# Patient Record
Sex: Male | Born: 1985 | Race: White | Hispanic: No | Marital: Married | Smoking: Never smoker
Health system: Southern US, Community
[De-identification: ages and names within clinical notes are randomized; demographics above are authoritative.]

## PROBLEM LIST (undated history)

## (undated) DIAGNOSIS — S83106A Unspecified dislocation of unspecified knee, initial encounter: Secondary | ICD-10-CM

## (undated) HISTORY — PX: TONSILLECTOMY: SUR1361

---
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
## 2006-07-27 ENCOUNTER — Emergency Department (HOSPITAL_COMMUNITY): Admission: EM | Admit: 2006-07-27 | Discharge: 2006-07-27 | Payer: Self-pay | Admitting: *Deleted

## 2007-01-23 ENCOUNTER — Emergency Department (HOSPITAL_COMMUNITY): Admission: EM | Admit: 2007-01-23 | Discharge: 2007-01-23 | Payer: Self-pay | Admitting: Family Medicine

## 2008-10-05 ENCOUNTER — Emergency Department (HOSPITAL_COMMUNITY): Admission: EM | Admit: 2008-10-05 | Discharge: 2008-10-05 | Payer: Self-pay | Admitting: Emergency Medicine

## 2010-07-31 NOTE — Op Note (Signed)
NAME:  Cole Wright, Cole Wright                         ACCOUNT NO.:  [id number]   MEDICAL RECORD NO.:  [id number]                   PATIENT TYPE:  AMB   LOCATION:  DSC                                  FACILITY:  [facility]   PHYSICIAN:  Kinnie Scales. Annalee Genta, M.D.            DATE OF BIRTH:  1986/03/05   DATE OF PROCEDURE:  09/24/2002  DATE OF DISCHARGE:                                 OPERATIVE REPORT   PREOPERATIVE DIAGNOSIS:  1. Adenotonsillar hypertrophy.  2. Recurrent tonsillitis.   POSTOPERATIVE DIAGNOSIS:  1. Adenotonsillar hypertrophy.  2. Recurrent tonsillitis.   OPERATION PERFORMED:  Tonsillectomy and adenoidectomy.   SURGEON:  Kinnie Scales. Annalee Genta, M.D.   ANESTHESIA:  General endotracheal.   COMPLICATIONS:  None.   ESTIMATED BLOOD LOSS:  Minimal.   DISPOSITION:  Patient transferred from the operating room to the recovery  room in stable condition.   INDICATIONS FOR PROCEDURE:  1. Adenotonsillar hypertrophy.  2. Recurrent tonsillitis.   BRIEF HISTORY:  [name] is a 22-1/2-year-old white male who was referred for  evaluation of recurrent acute tonsillitis.  The patient had multiple courses  of infection in the last year requiring antibiotic therapy and was found to  have adenotonsillar hypertrophy and chronic tonsillar discharge on  examination.  Given the patient's history, examination and findings, I  recommended that we undertake tonsillectomy and adenoidectomy under general  anesthesia.  The risks, benefits and possible complications of these  surgical procedures were discussed in detail with the patient and his  parents who understood and concurred with our plan for surgery which was  scheduled as above.   DESCRIPTION OF PROCEDURE:  The patient was brought to the operating room on  September 24, 2002 and placed in supine position on the operating table.  General  endotracheal anesthesia was established without difficulty.  When the  patient was adequately anesthetized, a  Crowe-Davis mouth gag was inserted  without difficulty.  There were no loose or broken teeth and the hard and  soft palate were intact.  The patient's posterior nasopharynx was examined.  He was found to have adenoidal hypertrophy and the adenoids were treated  using adenoidal ablation.  With suction cautery set at 45W, adenoid tissue  was ablated.  There was no bleeding.  The nasopharynx was irrigated and  suctioned.  Attention was then turned to the patient's tonsils.  Beginning  on the left hand side and dissected in subcapsular fashion, the entire left  tonsil was resected using a Harmonics scalpel.  Dissection carried out from  superior pole to tongue base.  The right tonsil was then removed in a  similar fashion.  Tonsil tissue sent to pathology for gross and microscopic  evaluation.  Tonsillar fossa was gently abraded with a dry tonsil sponge and  Crowe-Davis mouth gag was released and reapplied.  There were several small  areas of point hemorrhage which were cauterized with suction cautery.  No  evidence of active bleeding.  An orogastric tube was passed and the stomach  contents were aspirated.  The patient's nasal cavity and nasopharynx, oral  cavity and oropharynx were then thoroughly irrigated and suctioned.  The  Crowe-Davis mouth gag was released and removed.  There were no loose or  broken teeth.  The patient was then awakened from his anesthetic.  He was  extubated and was transferred from the operating room to recovery room in  stable condition.                                                 Kinnie Scales. Annalee Genta, M.D.    DLS/MEDQ  D:  52/84/1324  T:  09/24/2002  Job:  [id number]

## 2013-06-13 ENCOUNTER — Other Ambulatory Visit: Payer: Self-pay | Admitting: Physician Assistant

## 2013-06-13 ENCOUNTER — Ambulatory Visit
Admission: RE | Admit: 2013-06-13 | Discharge: 2013-06-13 | Disposition: A | Discharge status: Home / Self Care | Payer: BC Managed Care – PPO | Source: Ambulatory Visit | Attending: Physician Assistant | Admitting: Physician Assistant

## 2013-06-13 DIAGNOSIS — R059 Cough, unspecified: Secondary | ICD-10-CM

## 2013-06-13 DIAGNOSIS — R05 Cough: Secondary | ICD-10-CM

## 2016-08-15 ENCOUNTER — Emergency Department (HOSPITAL_COMMUNITY): Payer: BLUE CROSS/BLUE SHIELD

## 2016-08-15 ENCOUNTER — Emergency Department (HOSPITAL_COMMUNITY)
Admission: EM | Admit: 2016-08-15 | Discharge: 2016-08-15 | Disposition: A | Discharge status: Home / Self Care | Payer: BLUE CROSS/BLUE SHIELD | Attending: Emergency Medicine | Admitting: Emergency Medicine

## 2016-08-15 ENCOUNTER — Encounter (HOSPITAL_COMMUNITY): Payer: Self-pay

## 2016-08-15 DIAGNOSIS — Z79899 Other long term (current) drug therapy: Secondary | ICD-10-CM | POA: Insufficient documentation

## 2016-08-15 DIAGNOSIS — Y999 Unspecified external cause status: Secondary | ICD-10-CM | POA: Diagnosis not present

## 2016-08-15 DIAGNOSIS — Y929 Unspecified place or not applicable: Secondary | ICD-10-CM | POA: Diagnosis not present

## 2016-08-15 DIAGNOSIS — S79922A Unspecified injury of left thigh, initial encounter: Secondary | ICD-10-CM | POA: Diagnosis present

## 2016-08-15 DIAGNOSIS — S72422A Displaced fracture of lateral condyle of left femur, initial encounter for closed fracture: Secondary | ICD-10-CM | POA: Insufficient documentation

## 2016-08-15 DIAGNOSIS — X501XXA Overexertion from prolonged static or awkward postures, initial encounter: Secondary | ICD-10-CM | POA: Insufficient documentation

## 2016-08-15 DIAGNOSIS — Y939 Activity, unspecified: Secondary | ICD-10-CM | POA: Diagnosis not present

## 2016-08-15 HISTORY — DX: Unspecified dislocation of unspecified knee, initial encounter: S83.106A

## 2016-08-15 MED ORDER — IBUPROFEN 200 MG PO TABS
600.0000 mg | ORAL_TABLET | Freq: Once | ORAL | Status: AC
  Administered 2016-08-15: 600 mg via ORAL
  Filled 2016-08-15: qty 3

## 2016-08-15 MED ORDER — OXYCODONE-ACETAMINOPHEN 5-325 MG PO TABS
1.0000 | ORAL_TABLET | Freq: Four times a day (QID) | ORAL | 0 refills | Status: DC | PRN
Start: 2016-08-15 — End: 2022-03-10

## 2016-08-15 NOTE — ED Triage Notes (Signed)
Pt hx of knee dislocation.  Pt states stood up today and felt left knee pop. Painful to ambulate

## 2016-08-15 NOTE — ED Provider Notes (Signed)
[facility]-EMERGENCY DEPT Provider Note   CSN: [id number] Arrival date & time: 08/15/16  1808     History   Chief Complaint Chief Complaint  Patient presents with  . Knee Pain    HPI Cole Wright is a 31 y.o. male.  HPI Patient Presents with a likely left knee patellar dislocation. States he's done in the past. States she was standing changing is close and he felt a pop in his left knee. States his kneecap was off to the side of the left. States that it sometimes pops off a little bit but usually not like this. States it popped out and then back on its own. No other injury. He did not bend abnormally.   Past Medical History:  Diagnosis Date  . Dislocated knee     There are no active problems to display for this patient.   Past Surgical History:  Procedure Laterality Date  . TONSILLECTOMY         Home Medications    Prior to Admission medications   Medication Sig Start Date End Date Taking? Authorizing Provider  oxyCODONE-acetaminophen (PERCOCET/ROXICET) 5-325 MG tablet Take 1-2 tablets by mouth every 6 (six) hours as needed for severe pain. 08/15/16   Benjiman CorePickering, Tonita Bills, MD    Family History History reviewed. No pertinent family history.  Social History Social History  Substance Use Topics  . Smoking status: Never Smoker  . Smokeless tobacco: Never Used  . Alcohol use No     Allergies   Food   Review of Systems Review of Systems  Constitutional: Negative for appetite change.  Respiratory: Negative for shortness of breath.   Cardiovascular: Negative for chest pain.  Musculoskeletal:       Left knee pain.  Skin: Negative for rash.  Neurological: Negative for headaches.  Psychiatric/Behavioral: Negative for confusion.     Physical Exam Updated Vital Signs BP 124/72 (BP Location: Right Arm)   Pulse 78   Temp 98.4 F (36.9 C) (Oral)   Resp 16   SpO2 99%   Physical Exam  Constitutional: He appears well-developed.  Eyes: Pupils are equal, round,  and reactive to light.  Pulmonary/Chest: Effort normal.  Abdominal: Soft. There is no tenderness.  Musculoskeletal: He exhibits tenderness.  Tenderness to left knee. Some effusion. Decreased range of motion due to pain. Somewhat tender to medial aspect of right patella. Neurovascular intact in left foot.  Neurological: He is alert.     ED Treatments / Results  Labs (all labs ordered are listed, but only abnormal results are displayed) Labs Reviewed - No data to display  EKG  EKG Interpretation None       Radiology Ct Knee Left Wo Contrast  Result Date: 08/15/2016 CLINICAL DATA:  Left knee fracture after twisting injury. EXAM: CT OF THE  KNEE WITHOUT CONTRAST TECHNIQUE: Multidetector CT imaging of the knee was performed according to the standard protocol. Multiplanar CT image reconstructions were also generated. COMPARISON:  Radiographs from earlier on the same day FINDINGS: Bones/Joint/Cartilage An acute, closed fracture of the posterolateral corner of the lateral femoral condyle is noted with shearing of the fracture fragment and displacement anteriorly between the femoral condyles. This fragment measures approximately 24 x 13 x 9 mm. A separate laterally displaced fracture fragment measuring 7 x 3 x 5 mm is also noted. The patellofemoral and medial femorotibial compartments are intact. No tibial plateau fracture. The fibular head is unremarkable. There is a moderate suprapatellar joint effusion with hematocrit level laterally. Ligaments Suboptimally assessed  by CT. The alignment of the collateral and cruciate ligaments appear anatomic however. Muscles and Tendons No intramuscular hemorrhage or muscle atrophy. Intact appearing extensor mechanism tendons and patellar retinaculum. Soft tissues No soft tissue mass or hematoma. IMPRESSION: 1. An acute, closed fracture of the posterolateral corner of the lateral femoral condyle is noted with shearing of the fracture fragment and displacement  anteriorly between the femoral condyles. This fragment measures approximately 24 x 13 x 9 mm. 2. A separate laterally displaced fracture fragment measuring 7 x 3 x 5 mm is also noted. 3. Moderate suprapatellar joint effusion with hematocrit level. Electronically Signed   By: Tollie Eth M.D.   On: 08/15/2016 21:50   Dg Knee Complete 4 Views Left  Result Date: 08/15/2016 CLINICAL DATA:  Acute onset of left knee pain and popping. Initial encounter. EXAM: LEFT KNEE - COMPLETE 4+ VIEW COMPARISON:  Left knee radiographs performed 11/29/2004 FINDINGS: There appears to be avulsion of a large 3 cm lateral fragment of the lateral femoral condyle, including a portion of the articular surface, with a residual 0.7 cm fragment noted at the fracture site. The avulsed fragment is situated at the anterior aspect of the joint space. This is concerning for a twisting injury, with avulsion secondary to torque from the popliteus tendon, and subsequent anterior drift of the fragment. An associated moderate knee joint effusion is noted. Underlying lateral meniscal injury cannot be excluded. Mild edema is seen at Hoffa's fat pad. Medial soft tissue swelling is noted about the knee. IMPRESSION: 1. Avulsion of a large 3 cm lateral fragment of the lateral femoral condyle, including a portion of the articular surface, with a residual 0.7 cm fragment noted at the fracture site. Avulsed fragment is situated at the anterior aspect of the joint space. 2. Associated moderate knee joint effusion noted. Underlying lateral meniscal injury cannot be excluded. MRI of the left knee is recommended for further evaluation, as deemed clinically appropriate. These results were called by telephone at the time of interpretation on 08/15/2016 at 7:04 pm to Dr. Benjiman Core, who verbally acknowledged these results. Electronically Signed   By: Roanna Raider M.D.   On: 08/15/2016 19:11    Procedures Procedures (including critical care  time)  Medications Ordered in ED Medications  ibuprofen (ADVIL,MOTRIN) tablet 600 mg (600 mg Oral Given 08/15/16 1840)     Initial Impression / Assessment and Plan / ED Course  I have reviewed the triage vital signs and the nursing notes.  Pertinent labs & imaging results that were available during my care of the patient were reviewed by me and considered in my medical decision making (see chart for details).     Patient with knee pain. Initially thought to be patellar dislocation since he's had this in the past. However x-ray showed lateral condyle fracture. Discussed with Dr. Victorino Dike. CT scan done here. Immobilized and will follow-up in the office tomorrow. He will likely require surgery.  Final Clinical Impressions(s) / ED Diagnoses   Final diagnoses:  Closed displaced fracture of lateral condyle of left femur, initial encounter [facility])    New Prescriptions Discharge Medication List as of 08/15/2016 10:30 PM    START taking these medications   Details  oxyCODONE-acetaminophen (PERCOCET/ROXICET) 5-325 MG tablet Take 1-2 tablets by mouth every 6 (six) hours as needed for severe pain., Starting Sun 08/15/2016, Print         Benjiman Core, MD 08/15/16 [phone]

## 2019-01-31 IMAGING — CT CT KNEE*L* W/O CM
3 series · 14 of 33 positions shown, 17 images · non-contrast
Comparison: Radiographs from earlier on the same day

CLINICAL DATA: Left knee fracture after twisting injury.

EXAM:
CT OF THE  KNEE WITHOUT CONTRAST
TECHNIQUE: Multidetector CT imaging of the knee was performed according to the
standard protocol. Multiplanar CT image reconstructions were also
generated.

[Series 3: pelvis st · axial · 0.48mm/px · z∈[-346,-182]mm · 6 of 108 slices shown, 8 images]
[im 17/108  soft-tissue]
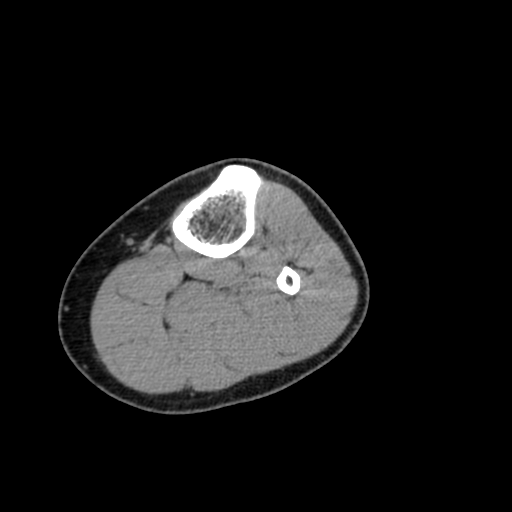
[im 17/108  bone]
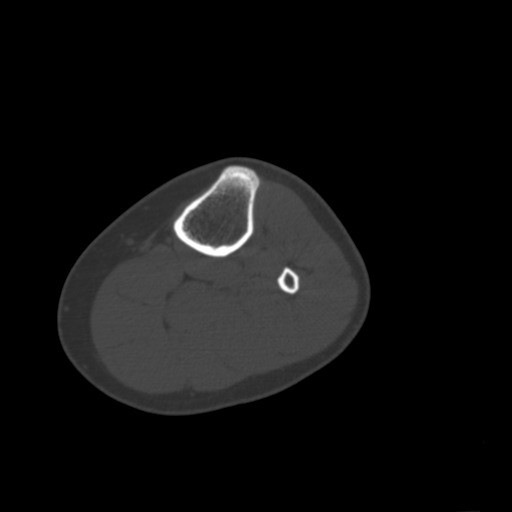
[im 33/108  bone]
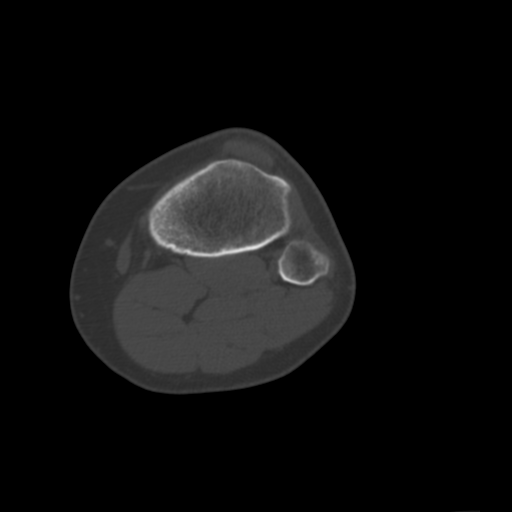
[im 50/108  bone]
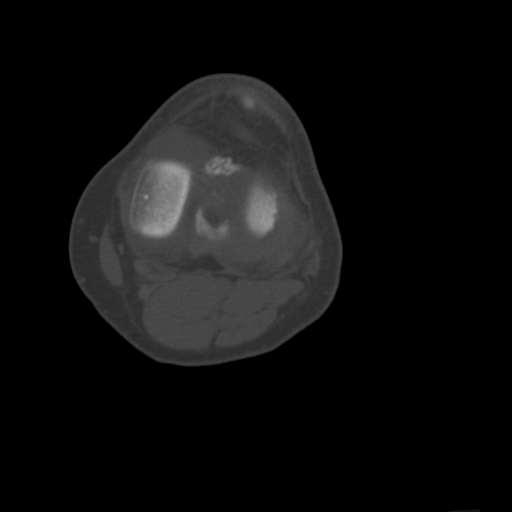
[im 66/108  bone]
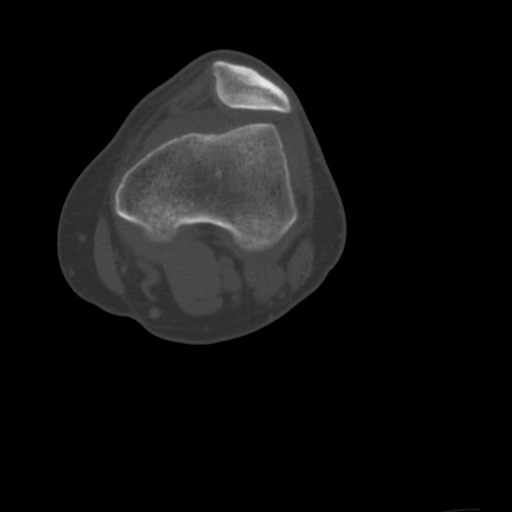
[im 83/108  soft-tissue]
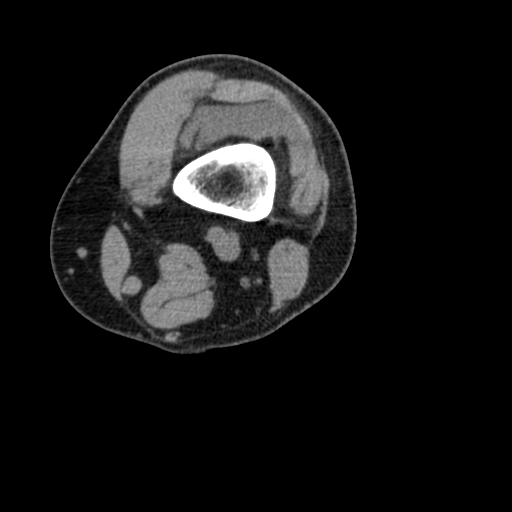
[im 83/108  bone]
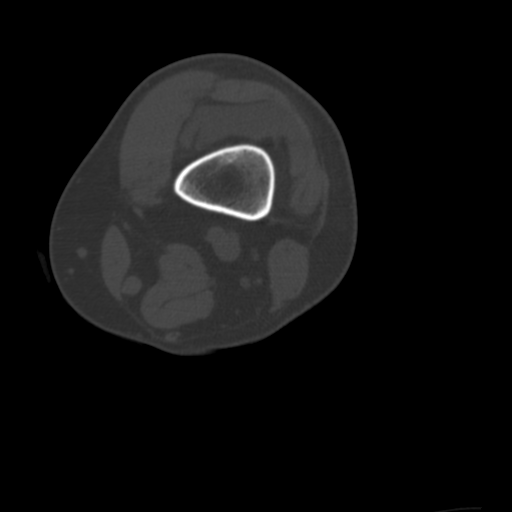
[im 99/108  bone]
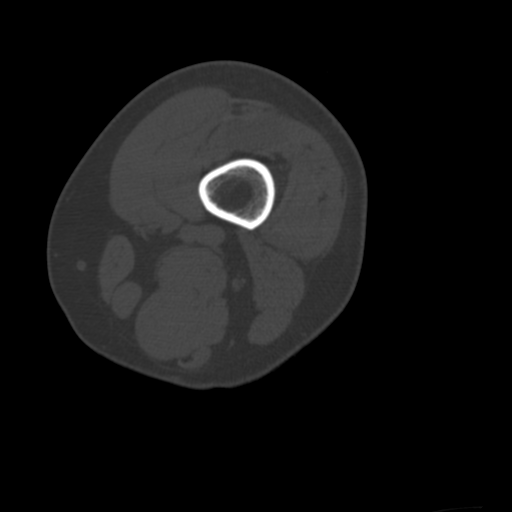

[Series 7: coronal images · coronal · 0.37mm/px · 3 of 74 slices shown]
[im 15/74  bone]
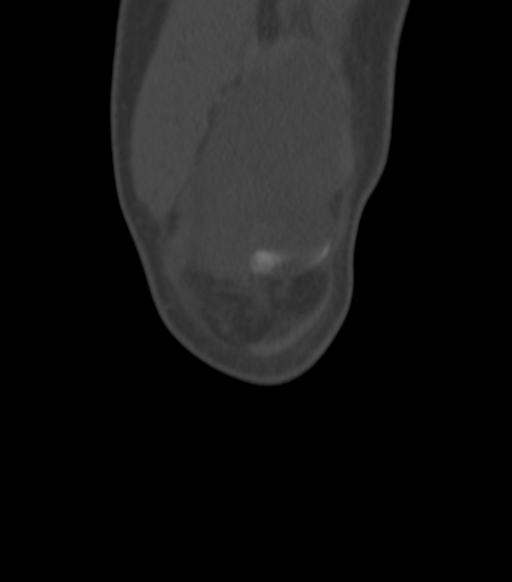
[im 30/74  bone]
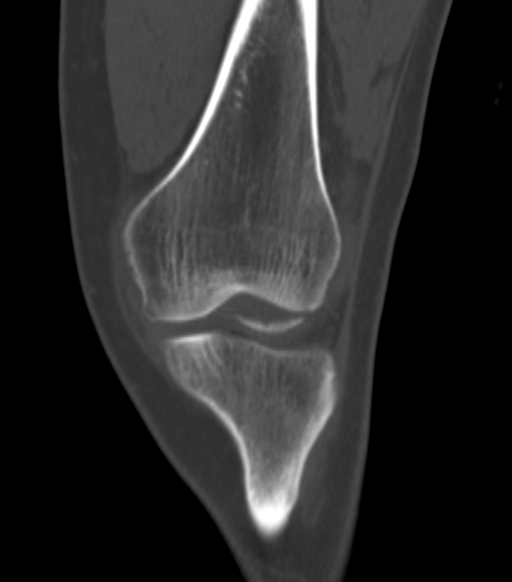
[im 44/74  bone]
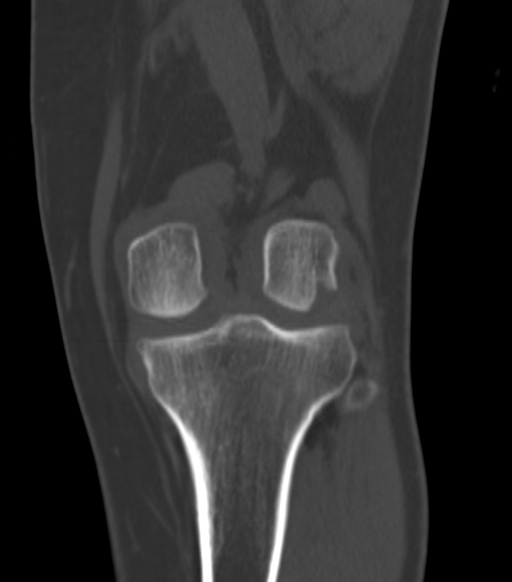

[Series 8: sagittal images · sagittal · 0.35mm/px · 5 of 90 slices shown, 6 images]
[im 30/90  bone]
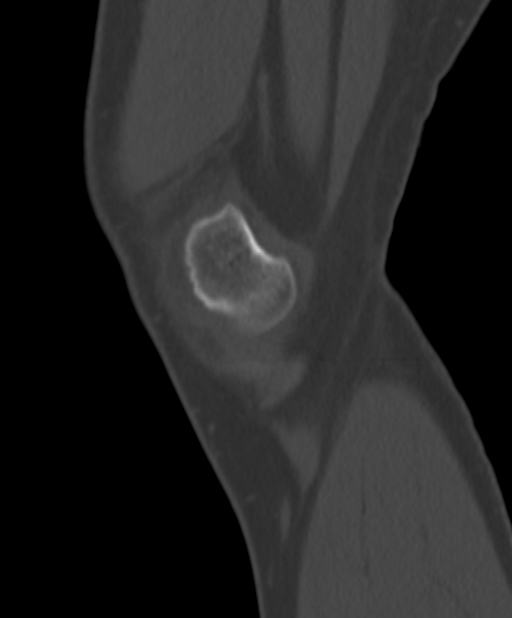
[im 38/90  bone]
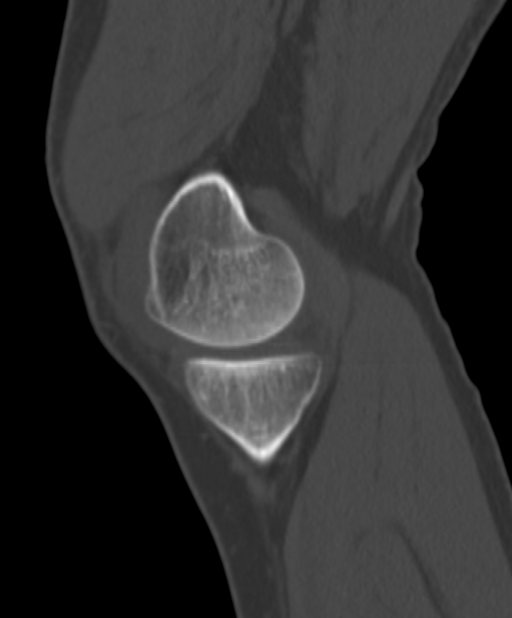
[im 45/90  soft-tissue]
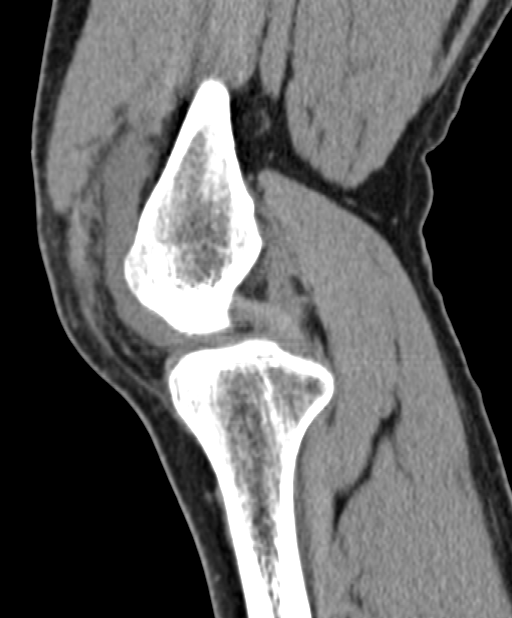
[im 45/90  bone]
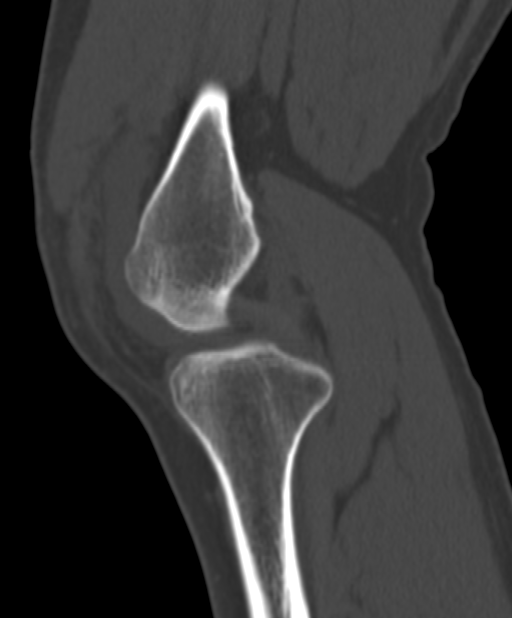
[im 52/90  bone]
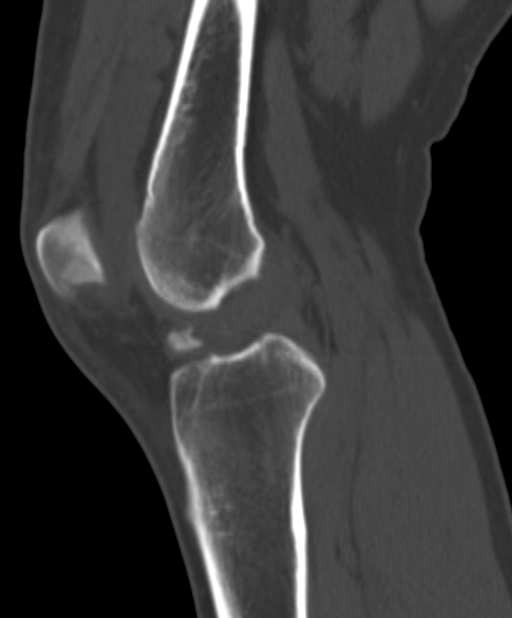
[im 60/90  bone]
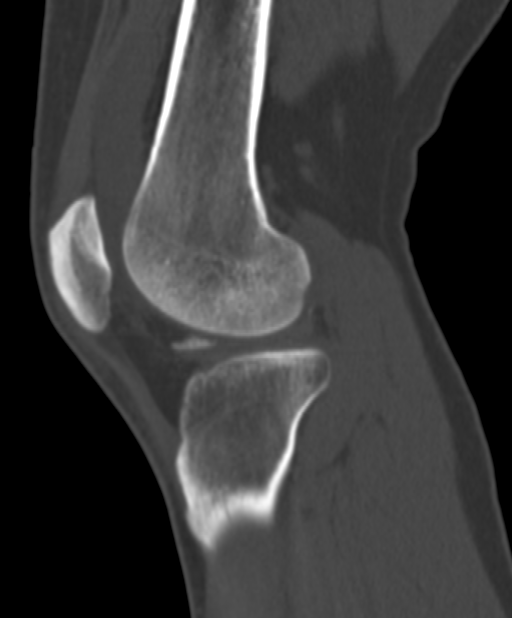

[14 of 33 positions shown; findings below may reference images not displayed]

FINDINGS: Bones/Joint/Cartilage

An acute, closed fracture of the posterolateral corner of the
lateral femoral condyle is noted with shearing of the fracture
fragment and displacement anteriorly between the femoral condyles.
This fragment measures approximately 24 x 13 x 9 mm. A separate
laterally displaced fracture fragment measuring 7 x 3 x 5 mm is also
noted. The patellofemoral and medial femorotibial compartments are
intact. No tibial plateau fracture. The fibular head is
unremarkable. There is a moderate suprapatellar joint effusion with
hematocrit level laterally.

Ligaments

Suboptimally assessed by CT. The alignment of the collateral and
cruciate ligaments appear anatomic however.

Muscles and Tendons

No intramuscular hemorrhage or muscle atrophy. Intact appearing
extensor mechanism tendons and patellar retinaculum.

Soft tissues

No soft tissue mass or hematoma.
IMPRESSION: 1. An acute, closed fracture of the posterolateral corner of the
lateral femoral condyle is noted with shearing of the fracture
fragment and displacement anteriorly between the femoral condyles.
This fragment measures approximately 24 x 13 x 9 mm.
2. A separate laterally displaced fracture fragment measuring 7 x 3
x 5 mm is also noted.
3. Moderate suprapatellar joint effusion with hematocrit level.

## 2021-05-25 NOTE — Progress Notes (Unsigned)
REFERRING PROVIDER: No referring provider defined for this encounter.  PRIMARY PROVIDER:  Patient, No Pcp Per (Inactive)  PRIMARY REASON FOR VISIT:  No diagnosis found.   HISTORY OF PRESENT ILLNESS:   Cole Wright, a 36 y.o. male, was seen for a [place] cancer genetics consultation at the request of Dr. Marland Kitchen due to a family history of a BRCA2 gene mutation.  Cole Wright presents to clinic today to discuss the possibility of a hereditary predisposition to cancer, to discuss genetic testing, and to further clarify his future cancer risks, as well as potential cancer risks for family members.   Cole Wright is a 36 y.o. male with no personal history of cancer.    CANCER HISTORY:  Oncology History   No history exists.   Past Medical History:  Diagnosis Date   Dislocated knee     Past Surgical History:  Procedure Laterality Date   TONSILLECTOMY      Social History   Socioeconomic History   Marital status: Married    Spouse name: Not on file   Number of children: Not on file   Years of education: Not on file   Highest education level: Not on file  Occupational History   Not on file  Tobacco Use   Smoking status: Never   Smokeless tobacco: Never  Substance and Sexual Activity   Alcohol use: No   Drug use: No   Sexual activity: Not on file  Other Topics Concern   Not on file  Social History Narrative   Not on file   Social Determinants of Health   Financial Resource Strain: Not on file  Food Insecurity: Not on file  Transportation Needs: Not on file  Physical Activity: Not on file  Stress: Not on file  Social Connections: Not on file     FAMILY HISTORY:  We obtained a detailed, 4-generation family history.  Significant diagnoses are listed below: ***   GENETIC COUNSELING ASSESSMENT: Cole Wright is a 36 y.o. male with a family history of a BRCA2 gene mutation. Therefore, we reviewed the cancer risks and management recommendations for individuals with a BRCA2 gene  mutation with a focus on management in males.  Clinical Information: Hereditary breast and ovarian cancer (HBOC) syndrome is characterized by an increased lifetime risk for, generally, adult-onset cancers including, breast, contralateral breast, male breast, ovarian, prostate, melanoma and pancreatic.  The cancers associated with BRCA2 are: Male breast cancer, up to an 84% risk Male breast cancer, up to an 8% risk Ovarian cancer, 17-27% risk Pancreatic cancer, up to a 7% risk Prostate cancer, up to a 34% risk Melanoma, elevated risk   Management Recommendations:  Breast Screening/Risk Reduction:  Males: Breast self-exam training and education starting at age 37 years Annual clinical breast exam starting at age 39 years  Consider annual mammogram starting at age 37 or 24 years before the earliest known male breast cancer in the family (whichever comes first).    Skin Cancer Screening and Risk Reduction: Regular skin self-examinations Individuals should notify their physicians of any changes to moles such as increasing in size, darkening in color, or other change in appearance. Annual skin examinations by a dermatologist  Follow sun-safety recommendations such as: Using UVA and UVB 30 SPF or higher sunscreen Avoiding sunburns Limiting sun exposure, especially during the hours of 11am-4pm  Wearing protective clothing and sunglasses Avoid using tanning beds For more information about the prevention of melanoma visit [email or web address]   Prostate Cancer Screening: Annual digital rectal  exam (DRE) at age 17 Annual PSA blood test at age 74  Pancreatic Cancer Screening/Risk Reduction: Avoid smoking, heavy alcohol use, and obesity. It has been suggested that pancreatic cancer screening be limited to those with a family history of pancreatic cancer (first- or second-degree relative). Ideally, screening should be performed in experienced centers utilizing a multidisciplinary approach  under research conditions. Recommended screening could include annual endoscopic ultrasound (preferred) and/or MRI of the pancreas starting at age 38 or 98 years younger than the earliest age diagnosis in the family. CA19-9 testing may be considered based on the physician's discretion.  Implications for Family Members: Hereditary predisposition to cancer due to pathogenic variants in the BRCA2 gene has autosomal dominant inheritance. This means that an individual with a pathogenic variant has a 50% chance of passing the condition on to his/her offspring. Identification of a pathogenic variant allows for the recognition of at-risk relatives who can pursue testing for the familial variant.  Additional Considerations: We discussed that some people do not want to undergo genetic testing due to fear of genetic discrimination.  A federal law called the Genetic Information Non-Discrimination Act (GINA) of 2008 helps protect individuals against genetic discrimination based on their genetic test results.  It impacts both health insurance and employment.  With health insurance, it protects against increased premiums, being kicked off insurance or being forced to take a test in order to be insured.  For employment it protects against hiring, firing and promoting decisions based on genetic test results.  GINA does not apply to those in the [employer], those who work for companies with less than 15 employees, and new life insurance or long-term disability insurance policies.  Health status due to a cancer diagnosis is not protected under GINA.  PLAN: After considering the risks, benefits, and limitations, Cole Wright provided informed consent to pursue genetic testing and the blood sample was sent to [facility] for analysis of the free familial BRCA2 site specific genetic testing. Results should be available within approximately 2-3 weeks' time, at which point they will be disclosed by telephone to Cole Wright, as  will any additional recommendations warranted by these results. Cole Wright will receive a summary of his genetic counseling visit and a copy of his results once available. This information will also be available in Epic.   Cole Wright questions were answered to his satisfaction today. Our contact information was provided should additional questions or concerns arise. Thank you for the referral and allowing [place] to share in the care of your patient.   Lucille Passy, MS, [facility] Genetic Counselor [email or web address] (P) [phone]  The patient was seen for a total of *** minutes in face-to-face genetic counseling.  ***The patient brought ***.  ***The patient was seen alone.  Drs. Lindi Adie and/or Burr Medico were available to discuss this case as needed.  _______________________________________________________________________ For Office Staff:  Number of people involved in session: *** Was an Intern/ student involved with case: {YES/NO:63}

## 2021-05-26 ENCOUNTER — Inpatient Hospital Stay: Payer: BC Managed Care – PPO

## 2021-05-26 ENCOUNTER — Inpatient Hospital Stay: Payer: BC Managed Care – PPO | Attending: Genetic Counselor | Admitting: Genetic Counselor

## 2021-05-26 ENCOUNTER — Other Ambulatory Visit: Payer: Self-pay

## 2021-05-26 DIAGNOSIS — Z8481 Family history of carrier of genetic disease: Secondary | ICD-10-CM | POA: Diagnosis not present

## 2021-05-27 ENCOUNTER — Encounter: Payer: Self-pay | Admitting: Genetic Counselor

## 2021-05-27 DIAGNOSIS — Z8481 Family history of carrier of genetic disease: Secondary | ICD-10-CM | POA: Insufficient documentation

## 2021-06-12 ENCOUNTER — Telehealth: Payer: Self-pay | Admitting: Genetic Counselor

## 2021-06-12 ENCOUNTER — Encounter: Payer: Self-pay | Admitting: Genetic Counselor

## 2021-06-12 DIAGNOSIS — Z1501 Genetic susceptibility to malignant neoplasm of breast: Secondary | ICD-10-CM | POA: Insufficient documentation

## 2021-06-12 NOTE — Telephone Encounter (Signed)
I contacted Mr. Stacey to discuss his genetic testing results. 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 Detailed clinic note to follow. ? ?The test report has been scanned into EPIC and is located under the Molecular Pathology section of the Results Review tab.  A portion of the result report is included below for reference.  ? ?Lucille Passy, MS, [place] ?Genetic Counselor ?[email or web address] ?(P) [phone] ? ? ?

## 2021-06-16 ENCOUNTER — Ambulatory Visit: Payer: Self-pay | Admitting: Genetic Counselor

## 2021-06-16 DIAGNOSIS — Z1501 Genetic susceptibility to malignant neoplasm of breast: Secondary | ICD-10-CM

## 2021-06-16 NOTE — Progress Notes (Signed)
HPI:   ?Mr. Schneck was previously seen in the [facility] due to a family history of a BRCA2 gene mutation in his mother. Please refer to our prior cancer genetics clinic note for more information regarding our discussion, assessment and recommendations, at the time. Mr. Blatz recent genetic test results were disclosed to him, as were recommendations warranted by these results. These results and recommendations are discussed in more detail below. ? ?CANCER HISTORY:  ?Oncology History  ? No history exists.  ? ? ?FAMILY HISTORY:  ?We obtained a detailed, 4-generation family history.  Significant diagnoses are listed below: ?     ?Family History  ?Problem Relation Age of Onset  ? Other Mother    ?      BRCA2+  ? Colon polyps Mother    ? Ovarian cancer Maternal Grandmother 36  ? Brain cancer Maternal Grandmother 36  ? Lung cancer Maternal Grandmother 47  ? Lung cancer Paternal Grandmother    ?      met to brain  ?  ?  ?  ?  ?Mr. Arey's mother was seen by genetics due to a personal history of colon polyps. Her genetic testing identified a BRCA2 gene mutation ([id number].[id number]*). His maternal grandmother was diagnosed with brain and lung cancer at age 35 and ovarian cancer at age 29, she died at 56. The only known family history of cancer on his paternal side of the family consists of a paternal grandmother with a history of lung cancer. ? 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 ? ?The test report has been scanned into EPIC and is located under the Molecular Pathology section of the Results Review tab.  A portion of the result report is included below for reference. Genetic testing reported out on 06/10/2021. ?  ? ? ? ? ? ?Clinical Information: ?Hereditary breast and ovarian cancer (HBOC) syndrome is characterized by an increased lifetime risk for, generally, adult-onset cancers  including, breast, contralateral breast, male breast, ovarian, prostate, melanoma and pancreatic. ? ?The cancers associated with BRCA2 are: ?Male breast cancer, up to an 84% risk ?In women with a history of breast cancer, the cumulative risk for contralateral breast cancer 5 years after breast cancer diagnosis is 9%. The cumulative 20 year risk is approximately 26%. ?Male breast cancer, up to an 8% risk ?Ovarian cancer, 17-27% risk ?Pancreatic cancer, up to a 7% risk ?Prostate cancer, up to a 34% risk ?Melanoma, elevated risk ?  ?Management Recommendations: ? ?Breast Screening/Risk Reduction: ? ?Women: ?Breast cancer screening includes: ?Breast awareness beginning at age 41 ?Monthly self-breast examination beginning at age 25 ?Clinical breast examination every 6-12 months beginning at age 4 or at the age of the earliest diagnosed breast cancer in the family, if onset was before age 32 ?Annual breast MRI with contrast beginning at age 81-29 (or annual mammograms with consideration of tomosynthesis if MRI is unavailable), although the age to initiate screening may be individualized based on family history ?Annual mammogram beginning at age 77 until age 19 with consideration of tomosynthesis with continuation of annual breast MRI with contrast ?The option of prophylactic bilateral risk-reducing mastectomy (RRM), removal of the breast tissue before cancer develops, is the best option for significantly decreasing the risk of developing breast cancer. Studies have shown mastectomies reduce the risk of breast cancer by 90-95% in women with a BRCA2 mutation. Breast reconstruction can  also be performed immediately following the mastectomy, depending on the type of reconstruction chosen. ?For women with a BRCA2 pathogenic or likely pathogenic variant who are treated for breast cancer and have not had a bilateral mastectomy, screening with annual mammogram with consideration of tomosynthesis and breast MRI should continue  as described above. ? ?Males: ?Breast self-exam training and education starting at age 33 years ?Annual clinical breast exam starting at age 54 years  ?Consider annual mammogram starting at age 36 or 38 years before the earliest known male breast cancer in the family (whichever comes first). ?  ?Gynecological Cancer Screening/Risk Reduction: ?It is recommended that women with a BRCA2 mutation consider having a risk-reducing salpingo oophorectomy (RRSO), removal of the ovaries and fallopian tubes, between the ages of 77-45 or once childbearing is completed. Having a RRSO is estimated to reduce the risk of ovarian cancer by up to 96%. There is still a small risk of developing an "ovarian-like" cancer in the lining of the abdomen, called the peritoneum. ?Another benefit to having the ovaries removed is the risk reduction for breast cancer. If the ovaries are removed before menopause, the risk of developing breast cancer is reduced. ?Women undergoing a RRSO should be aware of the potential risks and benefits of concurrent hysterectomy. Hormone replacement therapy could be considered based on the physician's discretion. ?Ovarian cancer screening is an option for women who chose not to have a RRSO or who, as of yet, have not completed their family. Current screening methods for ovarian cancer are neither sensitive nor specific, meaning that often early stage ovarian cancer cannot be diagnosed through this screening.  Screening can also be falsely positive with no cancer present. For this reason, ovarian cancer screening is not recommended for women beyond their child-bearing years. If ovarian cancer screening is recommended by your physician, it could include: ?CA-125 blood tests ?Transvaginal ultrasounds ?Clinical pelvic exams ?  ?Skin Cancer Screening and Risk Reduction: ?Regular skin self-examinations ?Individuals should notify their physicians of any changes to moles such as increasing in size, darkening in color, or  other change in appearance. ?Annual skin examinations by a dermatologist  ?Follow sun-safety recommendations such as: ?Using UVA and UVB 30 SPF or higher sunscreen ?Avoiding sunburns ?Limiting sun exposure, especially during the hours of 11am-4pm  ?Wearing protective clothing and sunglasses ?Avoid using tanning beds ?For more information about the prevention of melanoma visit [email or web address] ?  ?Prostate Cancer Screening: ?Annual digital rectal exam (DRE) at age 23 ?Annual PSA blood test at age 53 ? ?Pancreatic Cancer Screening/Risk Reduction: ?Avoid smoking, heavy alcohol use, and obesity. ?It has been suggested that pancreatic cancer screening be limited to those with a family history of pancreatic cancer (first- or second-degree relative). Ideally, screening should be performed in experienced centers utilizing a multidisciplinary approach under research conditions. Recommended screening could include annual endoscopic ultrasound (preferred) and/or MRI of the pancreas starting at age 33 or 42 years younger than the earliest age diagnosis in the family. ?CA19-9 testing may be considered based on the physician's discretion. ? ?Additional Considerations: ?Individuals at risk for developing breast and ovarian cancer may benefit from the use of medication to reduce their risk for cancer. These medications are referred to as chemoprevention. For example, oral contraceptive use has been shown to reduce the risk of ovarian cancer by approximately 60% in BRCA2 mutation carriers if taken for at least 5 years. This risk reduction remains even after discontinuation of oral contraceptives. ?Recent studies have suggested PARP inhibitors may be  a beneficial chemotherapeutic agent for a subset of patients with BRCA2-associated breast, ovarian, prostate, and pancreatic cancers. Clinical trials are currently in process to determine if and how these agents can be useful in the treatment of BRCA2 cancer patients ?Patients of  reproductive age should be made aware of options for prenatal diagnosis and assisted reproduction including pre-implantation genetic diagnosis. ?Individuals with a single pathogenic BRCA2 variant are also ca

## 2021-06-24 ENCOUNTER — Encounter: Payer: Self-pay | Admitting: Genetic Counselor

## 2022-02-25 ENCOUNTER — Ambulatory Visit
Admission: EM | Admit: 2022-02-25 | Discharge: 2022-02-25 | Disposition: A | Discharge status: Home / Self Care | Payer: BC Managed Care – PPO | Attending: Nurse Practitioner | Admitting: Nurse Practitioner

## 2022-02-25 ENCOUNTER — Encounter: Payer: Self-pay | Admitting: Emergency Medicine

## 2022-02-25 DIAGNOSIS — B349 Viral infection, unspecified: Secondary | ICD-10-CM | POA: Insufficient documentation

## 2022-02-25 DIAGNOSIS — R6889 Other general symptoms and signs: Secondary | ICD-10-CM | POA: Insufficient documentation

## 2022-02-25 DIAGNOSIS — Z1152 Encounter for screening for COVID-19: Secondary | ICD-10-CM | POA: Insufficient documentation

## 2022-02-25 MED ORDER — OSELTAMIVIR PHOSPHATE 75 MG PO CAPS: 75.0000 mg | ORAL_CAPSULE | Freq: Two times a day (BID) | ORAL | 0 refills | Status: DC

## 2022-02-25 NOTE — Discharge Instructions (Signed)
COVID/Flu test is pending.  You will be contacted if the pending test results are positive.  As discussed, if the flu test is negative and your COVID test is positive, you are a candidate to receive molnupiravir as an antiviral therapy. Take medication as prescribed. Recommend over-the-counter Tylenol or ibuprofen as needed for pain, fever, general discomfort. Increase fluids and allow for plenty of rest. Please be advised that a viral illness can last anywhere from 7 to 14 days.  If symptoms continue to persist after that time or if symptoms suddenly worsen, please follow-up with your primary care physician for further evaluation. Follow-up as needed.

## 2022-02-25 NOTE — ED Provider Notes (Signed)
[facility]    CSN: [id number] Arrival date & time: 02/25/22  1805      History   Chief Complaint No chief complaint on file.   HPI Cole Wright is a 36 y.o. male.   The history is provided by the patient.   The patient presents for complaints of chills, body aches, and fever that started today.  Patient denies ear pain, sore throat, headache, cough, abdominal pain, nausea, vomiting, or diarrhea.  Patient denies any known sick contacts.  Patient reports he has been vaccinated for COVID.  Patient has not taken any medication for his symptoms.  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 Authorizing Provider  oseltamivir (TAMIFLU) 75 MG capsule Take 1 capsule (75 mg total) by mouth every 12 (twelve) hours. 02/25/22  Yes Seanpatrick Maisano-Warren, Alda Lea, NP  oxyCODONE-acetaminophen (PERCOCET/ROXICET) 5-325 MG tablet Take 1-2 tablets by mouth every 6 (six) hours as needed for severe pain. 08/15/16   Davonna Belling, MD    Family History Family History  Problem Relation Age of Onset   Other Mother        BRCA2+   Colon polyps Mother    Ovarian cancer Maternal Grandmother 72   Brain cancer Maternal Grandmother 63   Lung cancer Maternal Grandmother 32   Lung cancer Paternal Grandmother        met to brain    Social History Social History   Tobacco Use   Smoking status: Never   Smokeless tobacco: Never  Substance Use Topics   Alcohol use: No   Drug use: No     Allergies   Food   Review of Systems Review of Systems Per HPI  Physical Exam Triage Vital Signs ED Triage Vitals  Enc Vitals Group     BP 02/25/22 1912 104/70      Pulse Rate 02/25/22 1912 85     Resp 02/25/22 1912 18     Temp 02/25/22 1912 100 F (37.8 C)     Temp Source 02/25/22 1912 Oral     SpO2 02/25/22 1912 96 %     Weight --      Height --      Head Circumference --      Peak Flow --      Pain Score 02/25/22 1913 2     Pain Loc --      Pain Edu? --      Excl. in [place]? --    No data found.  Updated Vital Signs BP 104/70 (BP Location: Right Arm)   Pulse 85   Temp 100 F (37.8 C) (Oral)   Resp 18   SpO2 96%   Visual Acuity Right Eye Distance:   Left Eye Distance:   Bilateral Distance:    Right Eye Near:   Left Eye Near:    Bilateral Near:     Physical Exam Vitals and nursing note reviewed.  Constitutional:      General: He is not in acute distress.    Appearance: Normal appearance. He is well-developed.  HENT:     Head: Normocephalic and  atraumatic.     Right Ear: Tympanic membrane, ear canal and external ear normal.     Left Ear: Tympanic membrane, ear canal and external ear normal.     Nose: Nose normal.     Right Turbinates: Enlarged and swollen.     Left Turbinates: Enlarged and swollen.     Right Sinus: No maxillary sinus tenderness or frontal sinus tenderness.     Left Sinus: No maxillary sinus tenderness or frontal sinus tenderness.     Mouth/Throat:     Lips: Pink.     Mouth: Mucous membranes are moist.     Pharynx: Oropharynx is clear. Uvula midline. No posterior oropharyngeal erythema.  Eyes:     Extraocular Movements: Extraocular movements intact.     Conjunctiva/sclera: Conjunctivae normal.     Pupils: Pupils are equal, round, and reactive to light.  Cardiovascular:     Rate and Rhythm: Normal rate and regular rhythm.     Heart sounds: No murmur heard. Pulmonary:     Effort: Pulmonary effort is normal. No respiratory distress.     Breath sounds: Normal breath sounds.  Abdominal:     General: Bowel sounds are normal.     Palpations: Abdomen is soft.     Tenderness: There is no abdominal tenderness.   Musculoskeletal:        General: No swelling.     Cervical back: Neck supple.  Skin:    General: Skin is warm and dry.     Capillary Refill: Capillary refill takes less than 2 seconds.  Neurological:     General: No focal deficit present.     Mental Status: He is alert and oriented to person, place, and time.  Psychiatric:        Mood and Affect: Mood normal.        Behavior: Behavior normal.      UC Treatments / Results  Labs (all labs ordered are listed, but only abnormal results are displayed) Labs Reviewed  RESP PANEL BY RT-PCR (FLU A&B, COVID) ARPGX2    EKG   Radiology No results found.  Procedures Procedures (including critical care time)  Medications Ordered in UC Medications - No data to display  Initial Impression / Assessment and Plan / UC Course  I have reviewed the triage vital signs and the nursing notes.  Pertinent labs & imaging results that were available during my care of the patient were reviewed by me and considered in my medical decision making (see chart for details).  The patient is well-appearing, he is in no acute distress, vital signs are stable.  Symptoms consistent with a viral illness/flulike symptoms.  COVID/flu test are pending.  Patient has elected to begin Tamiflu.  Will start patient on Tamiflu 75 mg until his COVID/flu test is resulted.  Patient was advised that if the COVID test is positive, he will need to stop the Tamiflu.  He would like to begin molnupiravir if the COVID test is positive.  Supportive care recommendations were provided to the patient.  Patient verbalizes understanding.  All questions were answered.  Patient stable for discharge.  Work note was provided.  Final Clinical Impressions(s) / UC Diagnoses   Final diagnoses:  Flu-like symptoms  Viral illness     Discharge Instructions      COVID/Flu test is pending.  You will be contacted if the pending test results are positive.  As discussed, if the flu test is  negative and your COVID test is positive, you are  a candidate to receive molnupiravir as an antiviral therapy. Take medication as prescribed. Recommend over-the-counter Tylenol or ibuprofen as needed for pain, fever, general discomfort. Increase fluids and allow for plenty of rest. Please be advised that a viral illness can last anywhere from 7 to 14 days.  If symptoms continue to persist after that time or if symptoms suddenly worsen, please follow-up with your primary care physician for further evaluation. Follow-up as needed.      ED Prescriptions     Medication Sig Dispense Auth. Provider   oseltamivir (TAMIFLU) 75 MG capsule Take 1 capsule (75 mg total) by mouth every 12 (twelve) hours. 10 capsule Jadasia Haws-Warren, Alda Lea, NP      PDMP not reviewed this encounter.   Tish Men, NP 02/25/22 1945

## 2022-02-25 NOTE — ED Triage Notes (Signed)
Chills and fever and body aches that started today

## 2022-02-26 LAB — RESP PANEL BY RT-PCR (FLU A&B, COVID) ARPGX2
Influenza A by PCR: NEGATIVE
Influenza B by PCR: NEGATIVE
SARS Coronavirus 2 by RT PCR: NEGATIVE

## 2022-03-10 ENCOUNTER — Ambulatory Visit
Admission: EM | Admit: 2022-03-10 | Discharge: 2022-03-10 | Disposition: A | Discharge status: Home / Self Care | Payer: BC Managed Care – PPO | Attending: Family Medicine | Admitting: Family Medicine

## 2022-03-10 ENCOUNTER — Encounter: Payer: Self-pay | Admitting: Emergency Medicine

## 2022-03-10 DIAGNOSIS — J209 Acute bronchitis, unspecified: Secondary | ICD-10-CM

## 2022-03-10 MED ORDER — AZITHROMYCIN 250 MG PO TABS
ORAL_TABLET | ORAL | 0 refills | Status: AC
  Filled 2022-03-10: qty 6, 5d supply, fill #0

## 2022-03-10 MED ORDER — BENZONATATE 200 MG PO CAPS
200.0000 mg | ORAL_CAPSULE | Freq: Three times a day (TID) | ORAL | 0 refills | Status: AC | PRN
  Filled 2022-03-10: qty 21, 7d supply, fill #0

## 2022-03-10 NOTE — Discharge Instructions (Signed)
Take the antibiotic as directed Drink lots of fluids Take Tessalon as needed for coughing See your doctor if not improving by next week

## 2022-03-10 NOTE — ED Triage Notes (Signed)
Patient c/o cough x 3-4 days, hurts to breathe, pain under left sided chest.  Patient denies any OTC cold meds.

## 2022-03-11 ENCOUNTER — Other Ambulatory Visit (HOSPITAL_COMMUNITY): Payer: Self-pay

## 2022-06-04 ENCOUNTER — Other Ambulatory Visit (HOSPITAL_COMMUNITY): Payer: Self-pay

## 2022-10-06 ENCOUNTER — Encounter (HOSPITAL_BASED_OUTPATIENT_CLINIC_OR_DEPARTMENT_OTHER): Payer: Self-pay | Admitting: Pediatrics

## 2022-10-06 ENCOUNTER — Other Ambulatory Visit: Payer: Self-pay

## 2022-10-06 ENCOUNTER — Other Ambulatory Visit (HOSPITAL_BASED_OUTPATIENT_CLINIC_OR_DEPARTMENT_OTHER): Payer: Self-pay

## 2022-10-06 ENCOUNTER — Emergency Department (HOSPITAL_BASED_OUTPATIENT_CLINIC_OR_DEPARTMENT_OTHER)
Admission: EM | Admit: 2022-10-06 | Discharge: 2022-10-06 | Disposition: A | Discharge status: Home / Self Care | Payer: BC Managed Care – PPO | Attending: Emergency Medicine | Admitting: Emergency Medicine

## 2022-10-06 DIAGNOSIS — M79601 Pain in right arm: Secondary | ICD-10-CM | POA: Insufficient documentation

## 2022-10-06 DIAGNOSIS — M7541 Impingement syndrome of right shoulder: Secondary | ICD-10-CM | POA: Insufficient documentation

## 2022-10-06 MED ORDER — ETODOLAC 400 MG PO TABS
400.0000 mg | ORAL_TABLET | Freq: Two times a day (BID) | ORAL | 0 refills | Status: AC
  Filled 2022-10-06 (×2): qty 20, 10d supply, fill #0

## 2022-10-06 MED ORDER — METHOCARBAMOL 500 MG PO TABS
500.0000 mg | ORAL_TABLET | Freq: Two times a day (BID) | ORAL | 0 refills | Status: AC
  Filled 2022-10-06: qty 20, 10d supply, fill #0

## 2022-10-06 NOTE — ED Notes (Signed)
Pt discharged to home using teachback Method. Discharge instructions have been discussed with patient and/or family members. Pt verbally acknowledges understanding d/c instructions, has been given opportunity for questions to be answered, and endorses comprehension to checkout at registration before leaving.  

## 2022-10-06 NOTE — ED Triage Notes (Signed)
C/O neck and right arm pain for a few days; no relieve from OTC aleve.

## 2022-10-06 NOTE — Discharge Instructions (Addendum)
Your exam was reassuring.  I have sent muscle relaxer and an anti-inflammatory medication to the pharmacy for you.  If you have any concerning symptoms return to the emergency room otherwise follow-up with your primary care provider.

## 2022-10-06 NOTE — ED Provider Notes (Signed)
[facility] Provider Note   CSN: [id number] Arrival date & time: 10/06/22  1235     History  Chief Complaint  Patient presents with   Arm Pain    Cole Wright is a 37 y.o. male.  37 year old male presents today for evaluation of right shoulder pain.  Started 2 days ago.  Some neck tightness as well.  Has not taken anything over-the-counter.  No chest pain, shortness of breath or other anginal symptoms.  The history is provided by the patient. No language interpreter was used.       Home Medications Prior to Admission medications   Medication Sig Start Date End Date Taking? Authorizing Provider  etodolac (LODINE) 400 MG tablet Take 1 tablet (400 mg total) by mouth 2 (two) times daily. 10/06/22  Yes Montford Barg, PA-C  methocarbamol (ROBAXIN) 500 MG tablet Take 1 tablet (500 mg total) by mouth 2 (two) times daily. 10/06/22  Yes Vermon Grays, PA-C  azithromycin (ZITHROMAX Z-PAK) 250 MG tablet Take 2 tablets by mouth today.  Starting tomorrow take 1 tablet a day until gone 03/10/22   Cole Moore, MD  benzonatate (TESSALON) 200 MG capsule Take 1 capsule (200 mg total) by mouth 3 (three) times daily as needed for cough. 03/10/22   Cole Moore, MD      Allergies    Food    Review of Systems   Review of Systems  Constitutional:  Negative for fever.  Respiratory:  Negative for shortness of breath.   Cardiovascular:  Negative for chest pain.  Gastrointestinal:  Negative for nausea.  Musculoskeletal:  Positive for arthralgias.  All other systems reviewed and are negative.   Physical Exam Updated Vital Signs BP 115/85   Pulse 69   Temp 97.7 F (36.5 C) (Oral)   Resp (!) 22   Ht 5\' 7"  (1.702 m)   Wt 90.7 kg   SpO2 100%   BMI 31.32 kg/m  Physical Exam Vitals and nursing note reviewed.  Constitutional:      General: He is not in acute distress.    Appearance: Normal appearance. He is not ill-appearing.  HENT:      Head: Normocephalic and atraumatic.     Nose: Nose normal.  Eyes:     Conjunctiva/sclera: Conjunctivae normal.  Cardiovascular:     Rate and Rhythm: Normal rate.  Pulmonary:     Effort: Pulmonary effort is normal. No respiratory distress.  Musculoskeletal:        General: No deformity. Normal range of motion.     Comments: Good range of motion bilateral upper extremities.  5/5 strength.  Cervical, thoracic and lumbar spine without tenderness palpation.  Empty can test positive on the right.  Hawkins test positive on the right.  Skin:    Findings: No rash.  Neurological:     Mental Status: He is alert.     ED Results / Procedures / Treatments   Labs (all labs ordered are listed, but only abnormal results are displayed) Labs Reviewed - No data to display  EKG None  Radiology No results found.  Procedures Procedures    Medications Ordered in ED Medications - No data to display  ED Course/ Medical Decision Making/ A&P                             Medical Decision Making Risk Prescription drug management.   Patient presents with  right shoulder pain.  Impingement tests are positive.  Likely impingement syndrome.  No anginal symptoms.  EKG without acute ischemic changes.  Symptomatic management discussed.  Low heart score.  He is appropriate for discharge.  Discharged in stable condition.   Final Clinical Impression(s) / ED Diagnoses Final diagnoses:  Right arm pain  Impingement syndrome of right shoulder    Rx / DC Orders ED Discharge Orders          Ordered    etodolac (LODINE) 400 MG tablet  2 times daily        10/06/22 1430    methocarbamol (ROBAXIN) 500 MG tablet  2 times daily        10/06/22 1430              Marita Kansas, PA-C 10/06/22 1445    Vanetta Mulders, MD 10/06/22 [phone]

## 2023-06-13 ENCOUNTER — Ambulatory Visit
Admission: EM | Admit: 2023-06-13 | Discharge: 2023-06-13 | Disposition: A | Discharge status: Home / Self Care | Attending: Family Medicine | Admitting: Family Medicine

## 2023-06-13 DIAGNOSIS — U071 COVID-19: Secondary | ICD-10-CM

## 2023-06-13 LAB — POC COVID19/FLU A&B COMBO
Covid Antigen, POC: POSITIVE — AB
Influenza A Antigen, POC: NEGATIVE
Influenza B Antigen, POC: NEGATIVE

## 2023-06-13 MED ORDER — PROMETHAZINE-DM 6.25-15 MG/5ML PO SYRP: 5.0000 mL | ORAL_SOLUTION | Freq: Four times a day (QID) | ORAL | 0 refills | Status: AC | PRN

## 2023-06-13 NOTE — Discharge Instructions (Signed)
 I have sent in some cough syrup in case you would like to take that, and you may use nasal sprays such as Rhinocort, Nasacort or Flonase twice daily until feeling better.  You may also use medications such as Mucinex, DayQuil, NyQuil and saline sinus rinses as well as ibuprofen and Tylenol for pain and fever.  Return for worsening symptoms.

## 2023-06-13 NOTE — ED Triage Notes (Signed)
 Pt reports cough, congestion, fever, chills, body aches, headache x 1 day

## 2023-06-14 NOTE — ED Provider Notes (Signed)
 [facility]    CSN: [id number] Arrival date & time: 06/13/23  1800      History   Chief Complaint No chief complaint on file.   HPI Cole Wright is a 38 y.o. male.   Patient presenting today with 1 day history of fever, chills, body aches, cough, congestion, headache.  Denies chest pain, shortness of breath, abdominal pain, vomiting, diarrhea.  So far not trying anything over-the-counter for symptoms.  No known history of chronic pulmonary disease.    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 Authorizing Provider  promethazine-dextromethorphan (PROMETHAZINE-DM) 6.25-15 MG/5ML syrup Take 5 mLs by mouth 4 (four) times daily as needed. 06/13/23  Yes Particia Nearing, PA-C  azithromycin (ZITHROMAX Z-PAK) 250 MG tablet Take 2 tablets by mouth today.  Starting tomorrow take 1 tablet a day until gone 03/10/22   Eustace Moore, MD  benzonatate (TESSALON) 200 MG capsule Take 1 capsule (200 mg total) by mouth 3 (three) times daily as needed for cough. 03/10/22   Eustace Moore, MD  etodolac (LODINE) 400 MG tablet Take 1 tablet (400 mg total) by mouth 2 (two) times daily. 10/06/22   Marita Kansas, PA-C  methocarbamol (ROBAXIN) 500 MG tablet Take 1 tablet (500 mg total) by mouth 2 (two) times daily. 10/06/22   Marita Kansas, PA-C    Family History Family History  Problem Relation Age of Onset   Other Mother        BRCA2+   Colon polyps Mother    Ovarian cancer Maternal Grandmother 36   Brain cancer Maternal Grandmother 68   Lung cancer Maternal Grandmother 72   Lung cancer Paternal Grandmother        met to brain    Social  History Social History   Tobacco Use   Smoking status: Never   Smokeless tobacco: Never  Substance Use Topics   Alcohol use: No   Drug use: No     Allergies   Food   Review of Systems Review of Systems Per HPI  Physical Exam Triage Vital Signs ED Triage Vitals  Encounter Vitals Group     BP 06/13/23 1934 113/74     Systolic BP Percentile --      Diastolic BP Percentile --      Pulse Rate 06/13/23 1934 81     Resp 06/13/23 1934 18     Temp 06/13/23 1934 98.6 F (37 C)     Temp Source 06/13/23 1934 Oral     SpO2 06/13/23 1934 97 %     Weight --      Height --      Head Circumference --      Peak Flow --      Pain Score 06/13/23 1936 0     Pain Loc --      Pain Education --      Exclude from Growth Chart --    No data found.  Updated Vital Signs BP 113/74 (BP Location: Right Arm)   Pulse 81   Temp 98.6 F (37 C) (Oral)   Resp  18   SpO2 97%   Visual Acuity Right Eye Distance:   Left Eye Distance:   Bilateral Distance:    Right Eye Near:   Left Eye Near:    Bilateral Near:     Physical Exam Vitals and nursing note reviewed.  Constitutional:      Appearance: He is well-developed.  HENT:     Head: Atraumatic.     Right Ear: External ear normal.     Left Ear: External ear normal.     Nose: Rhinorrhea present.     Mouth/Throat:     Pharynx: Posterior oropharyngeal erythema present. No oropharyngeal exudate.  Eyes:     Conjunctiva/sclera: Conjunctivae normal.     Pupils: Pupils are equal, round, and reactive to light.  Cardiovascular:     Rate and Rhythm: Normal rate and regular rhythm.  Pulmonary:     Effort: Pulmonary effort is normal. No respiratory distress.     Breath sounds: No wheezing or rales.  Musculoskeletal:        General: Normal range of motion.     Cervical back: Normal range of motion and neck supple.  Lymphadenopathy:     Cervical: No cervical adenopathy.  Skin:    General: Skin is warm and dry.  Neurological:     Mental  Status: He is alert and oriented to person, place, and time.  Psychiatric:        Behavior: Behavior normal.      UC Treatments / Results  Labs (all labs ordered are listed, but only abnormal results are displayed) Labs Reviewed  POC COVID19/FLU A&B COMBO - Abnormal; Notable for the following components:      Result Value   Covid Antigen, POC Positive (*)    All other components within normal limits    EKG   Radiology No results found.  Procedures Procedures (including critical care time)  Medications Ordered in UC Medications - No data to display  Initial Impression / Assessment and Plan / UC Course  I have reviewed the triage vital signs and the nursing notes.  Pertinent labs & imaging results that were available during my care of the patient were reviewed by me and considered in my medical decision making (see chart for details).     Vitals and exam reassuring today, rapid COVID-positive.  Treat with Phenergan DM, supportive over-the-counter medications and home care.  Return for worsening symptoms.  Work note given.  Final Clinical Impressions(s) / UC Diagnoses   Final diagnoses:  COVID-19     Discharge Instructions      I have sent in some cough syrup in case you would like to take that, and you may use nasal sprays such as Rhinocort, Nasacort or Flonase twice daily until feeling better.  You may also use medications such as Mucinex, DayQuil, NyQuil and saline sinus rinses as well as ibuprofen and Tylenol for pain and fever.  Return for worsening symptoms.    ED Prescriptions     Medication Sig Dispense Auth. Provider   promethazine-dextromethorphan (PROMETHAZINE-DM) 6.25-15 MG/5ML syrup Take 5 mLs by mouth 4 (four) times daily as needed. 100 mL Particia Nearing, [place]      PDMP not reviewed this encounter.   Particia Nearing, [place] 06/14/23 1544
